# Patient Record
Sex: Female | Born: 1945 | Race: Black or African American | Hispanic: No | Smoking: Never smoker
Health system: Southern US, Community
[De-identification: ages and names within clinical notes are randomized; demographics above are authoritative.]

## PROBLEM LIST (undated history)

## (undated) DIAGNOSIS — I10 Essential (primary) hypertension: Secondary | ICD-10-CM

## (undated) HISTORY — PX: TUBAL LIGATION: SHX77

## (undated) HISTORY — PX: FOOT SURGERY: SHX648

---

## 1999-12-19 ENCOUNTER — Other Ambulatory Visit: Admission: RE | Admit: 1999-12-19 | Discharge: 1999-12-19 | Payer: Self-pay | Admitting: Gynecology

## 2000-12-22 ENCOUNTER — Other Ambulatory Visit: Admission: RE | Admit: 2000-12-22 | Discharge: 2000-12-22 | Payer: Self-pay | Admitting: Gynecology

## 2000-12-29 ENCOUNTER — Encounter: Payer: Self-pay | Admitting: Gynecology

## 2000-12-29 ENCOUNTER — Encounter: Admission: RE | Admit: 2000-12-29 | Discharge: 2000-12-29 | Payer: Self-pay | Admitting: Gynecology

## 2001-07-09 ENCOUNTER — Encounter: Payer: Self-pay | Admitting: Cardiology

## 2001-07-09 ENCOUNTER — Ambulatory Visit (HOSPITAL_COMMUNITY): Admission: RE | Admit: 2001-07-09 | Discharge: 2001-07-09 | Payer: Self-pay | Admitting: Cardiology

## 2002-01-26 ENCOUNTER — Encounter: Payer: Self-pay | Admitting: Gynecology

## 2002-01-26 ENCOUNTER — Encounter: Admission: RE | Admit: 2002-01-26 | Discharge: 2002-01-26 | Payer: Self-pay | Admitting: Gynecology

## 2002-04-27 ENCOUNTER — Other Ambulatory Visit: Admission: RE | Admit: 2002-04-27 | Discharge: 2002-04-27 | Payer: Self-pay | Admitting: Gynecology

## 2002-06-22 ENCOUNTER — Encounter: Payer: Self-pay | Admitting: Cardiology

## 2002-06-22 ENCOUNTER — Encounter: Admission: RE | Admit: 2002-06-22 | Discharge: 2002-06-22 | Payer: Self-pay | Admitting: Cardiology

## 2003-02-03 ENCOUNTER — Encounter: Payer: Self-pay | Admitting: Gynecology

## 2003-02-03 ENCOUNTER — Encounter: Admission: RE | Admit: 2003-02-03 | Discharge: 2003-02-03 | Payer: Self-pay | Admitting: Gynecology

## 2003-05-18 ENCOUNTER — Other Ambulatory Visit: Admission: RE | Admit: 2003-05-18 | Discharge: 2003-05-18 | Payer: Self-pay | Admitting: Gynecology

## 2004-02-07 ENCOUNTER — Encounter: Admission: RE | Admit: 2004-02-07 | Discharge: 2004-02-07 | Payer: Self-pay | Admitting: Gynecology

## 2004-02-07 ENCOUNTER — Other Ambulatory Visit: Admission: RE | Admit: 2004-02-07 | Discharge: 2004-02-07 | Payer: Self-pay | Admitting: Gynecology

## 2005-02-11 ENCOUNTER — Ambulatory Visit (HOSPITAL_COMMUNITY): Admission: RE | Admit: 2005-02-11 | Discharge: 2005-02-11 | Payer: Self-pay | Admitting: Gynecology

## 2005-02-11 ENCOUNTER — Other Ambulatory Visit: Admission: RE | Admit: 2005-02-11 | Discharge: 2005-02-11 | Payer: Self-pay | Admitting: Gynecology

## 2005-02-12 ENCOUNTER — Encounter: Admission: RE | Admit: 2005-02-12 | Discharge: 2005-02-12 | Payer: Self-pay | Admitting: Cardiology

## 2006-02-12 ENCOUNTER — Encounter: Admission: RE | Admit: 2006-02-12 | Discharge: 2006-02-12 | Payer: Self-pay | Admitting: Gynecology

## 2006-03-13 ENCOUNTER — Other Ambulatory Visit: Admission: RE | Admit: 2006-03-13 | Discharge: 2006-03-13 | Payer: Self-pay | Admitting: Gynecology

## 2006-07-28 ENCOUNTER — Encounter: Admission: RE | Admit: 2006-07-28 | Discharge: 2006-07-28 | Payer: Self-pay | Admitting: Cardiology

## 2006-11-13 ENCOUNTER — Encounter: Admission: RE | Admit: 2006-11-13 | Discharge: 2006-11-13 | Payer: Self-pay | Admitting: Rheumatology

## 2006-11-23 ENCOUNTER — Encounter: Admission: RE | Admit: 2006-11-23 | Discharge: 2006-11-23 | Payer: Self-pay | Admitting: Rheumatology

## 2007-02-16 ENCOUNTER — Encounter: Admission: RE | Admit: 2007-02-16 | Discharge: 2007-02-16 | Payer: Self-pay | Admitting: Gynecology

## 2007-02-20 ENCOUNTER — Encounter: Admission: RE | Admit: 2007-02-20 | Discharge: 2007-02-20 | Payer: Self-pay | Admitting: Rheumatology

## 2007-03-16 ENCOUNTER — Other Ambulatory Visit: Admission: RE | Admit: 2007-03-16 | Discharge: 2007-03-16 | Payer: Self-pay | Admitting: Gynecology

## 2008-02-17 ENCOUNTER — Encounter: Admission: RE | Admit: 2008-02-17 | Discharge: 2008-02-17 | Payer: Self-pay | Admitting: Gynecology

## 2008-05-24 ENCOUNTER — Encounter: Admission: RE | Admit: 2008-05-24 | Discharge: 2008-05-24 | Payer: Self-pay | Admitting: Rheumatology

## 2008-08-10 ENCOUNTER — Encounter: Admission: RE | Admit: 2008-08-10 | Discharge: 2008-08-10 | Payer: Self-pay | Admitting: Cardiology

## 2008-09-01 ENCOUNTER — Ambulatory Visit: Payer: Self-pay | Admitting: Thoracic Surgery

## 2008-11-16 ENCOUNTER — Encounter: Admission: RE | Admit: 2008-11-16 | Discharge: 2008-11-16 | Payer: Self-pay | Admitting: Thoracic Surgery

## 2008-11-16 ENCOUNTER — Ambulatory Visit: Payer: Self-pay | Admitting: Thoracic Surgery

## 2009-02-20 ENCOUNTER — Encounter: Admission: RE | Admit: 2009-02-20 | Discharge: 2009-02-20 | Payer: Self-pay | Admitting: Gynecology

## 2009-05-24 ENCOUNTER — Ambulatory Visit: Payer: Self-pay | Admitting: Thoracic Surgery

## 2009-05-24 ENCOUNTER — Encounter: Admission: RE | Admit: 2009-05-24 | Discharge: 2009-05-24 | Payer: Self-pay | Admitting: Thoracic Surgery

## 2009-09-25 ENCOUNTER — Ambulatory Visit: Payer: Self-pay | Admitting: Orthopedic Surgery

## 2009-09-25 DIAGNOSIS — M25569 Pain in unspecified knee: Secondary | ICD-10-CM

## 2009-09-27 ENCOUNTER — Telehealth: Payer: Self-pay | Admitting: Orthopedic Surgery

## 2009-10-03 ENCOUNTER — Encounter: Payer: Self-pay | Admitting: Orthopedic Surgery

## 2010-02-21 ENCOUNTER — Encounter: Admission: RE | Admit: 2010-02-21 | Discharge: 2010-02-21 | Payer: Self-pay | Admitting: Gynecology

## 2010-03-14 ENCOUNTER — Ambulatory Visit: Payer: Self-pay | Admitting: Thoracic Surgery

## 2010-03-14 ENCOUNTER — Encounter: Admission: RE | Admit: 2010-03-14 | Discharge: 2010-03-14 | Payer: Self-pay | Admitting: Thoracic Surgery

## 2010-05-14 IMAGING — CT CT CHEST W/O CM
2 of 4 series · 15 of 36 positions shown, 18 images · non-contrast
Comparison: Chest x-ray of 05/24/2008 and 02/20/2007

CLINICAL DATA: Follow up of lung nodule

CT CHEST WITHOUT CONTRAST
TECHNIQUE: Multidetector CT imaging of the chest was performed
following the standard protocol without IV contrast.

[Series 3: routine chest · axial · 0.55mm/px · z∈[-214,+32]mm · 12 of 59 slices shown, 15 images]
[im 5/59  mediastinal]
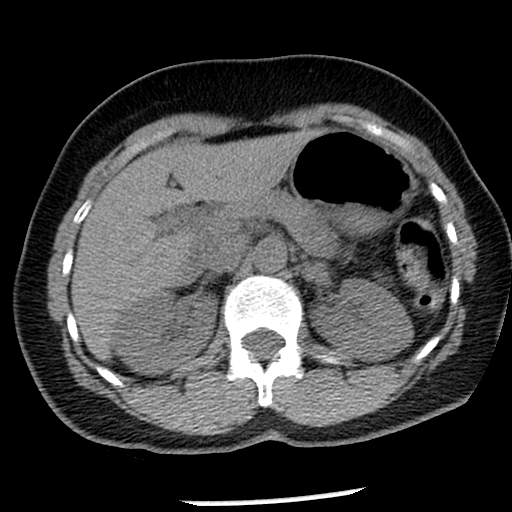
[im 5/59  lung]
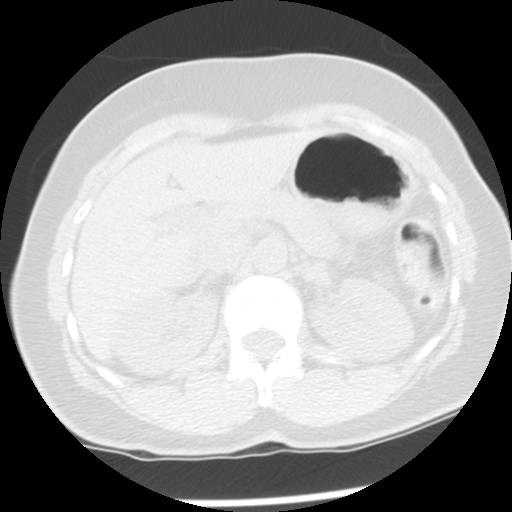
[im 9/59  lung]
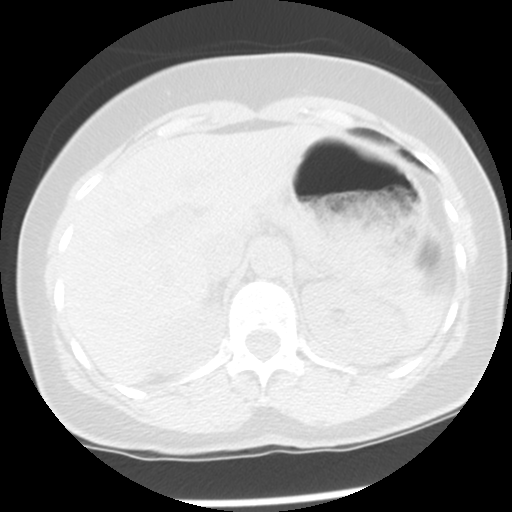
[im 13/59  lung]
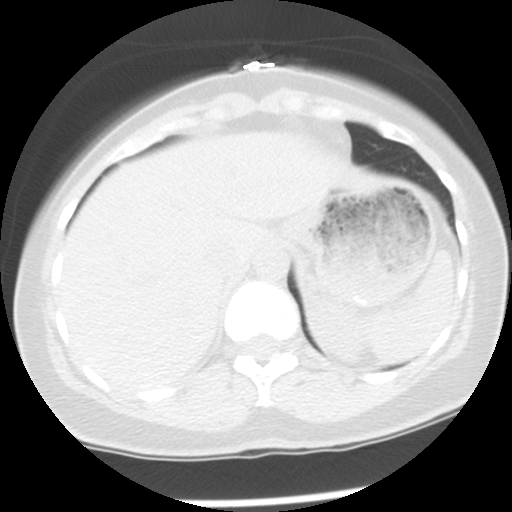
[im 17/59  lung]
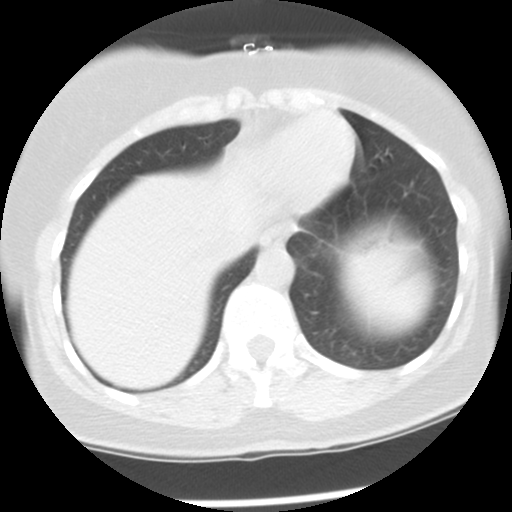
[im 21/59  mediastinal]
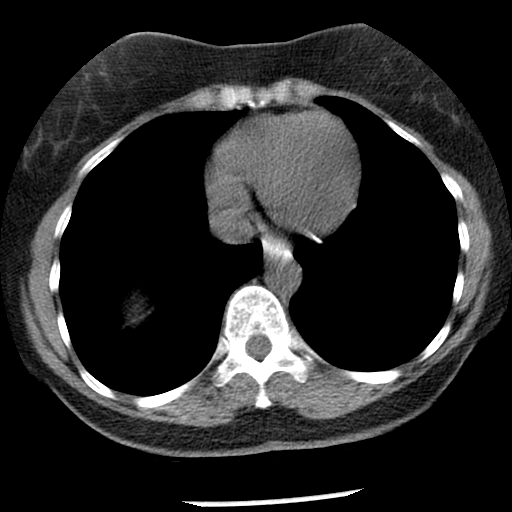
[im 21/59  lung]
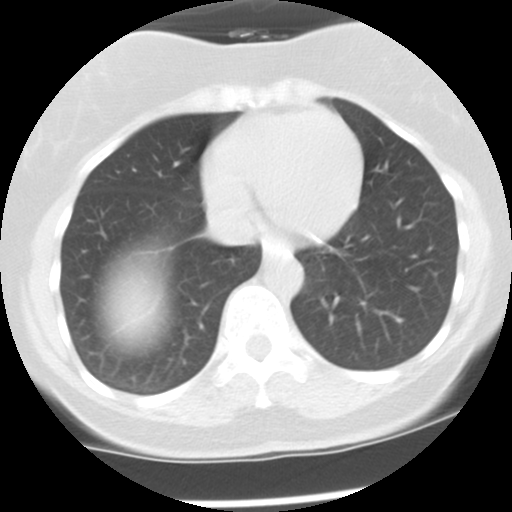
[im 25/59  lung]
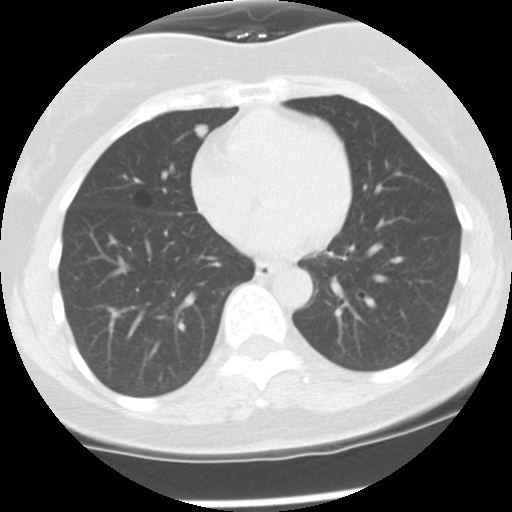
[im 34/59  lung]
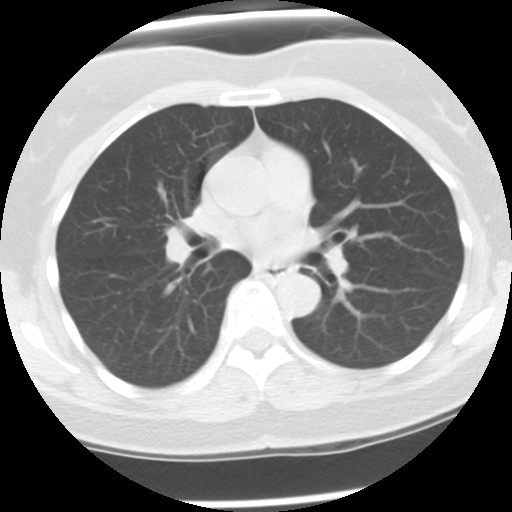
[im 38/59  lung]
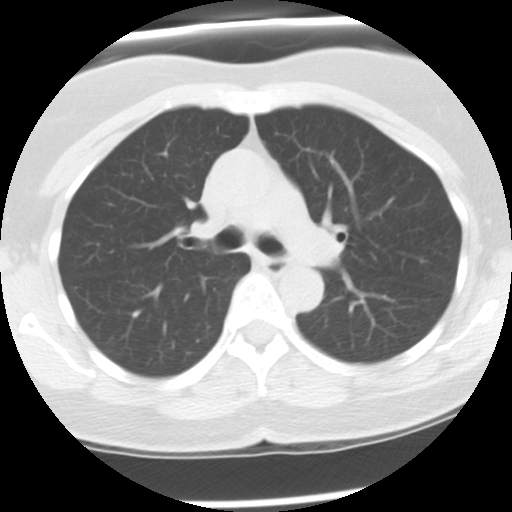
[im 42/59  mediastinal]
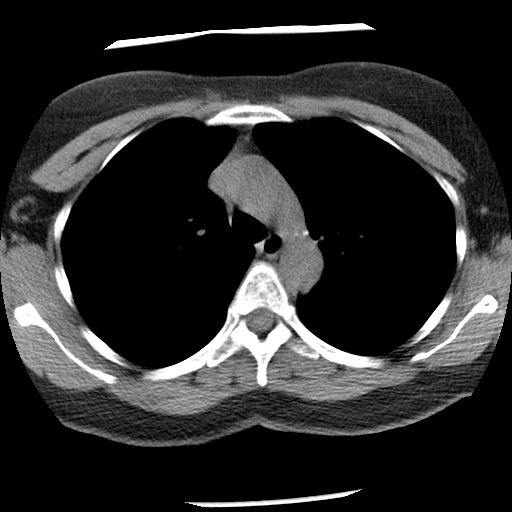
[im 42/59  lung]
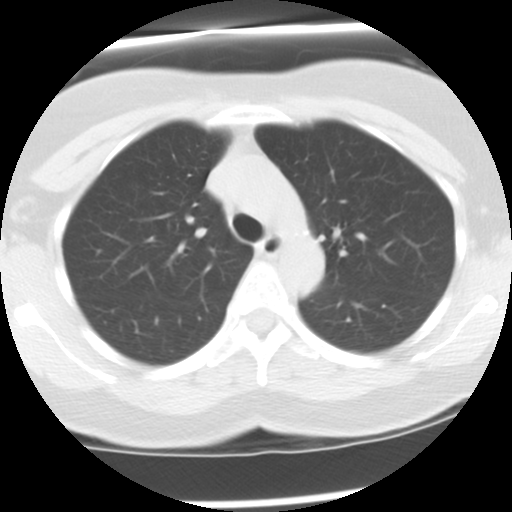
[im 46/59  lung]
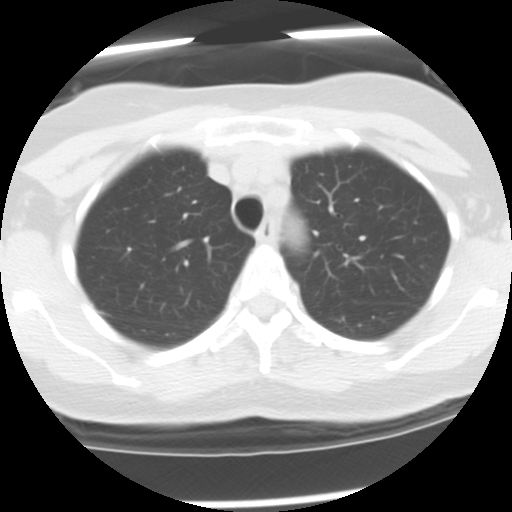
[im 50/59  lung]
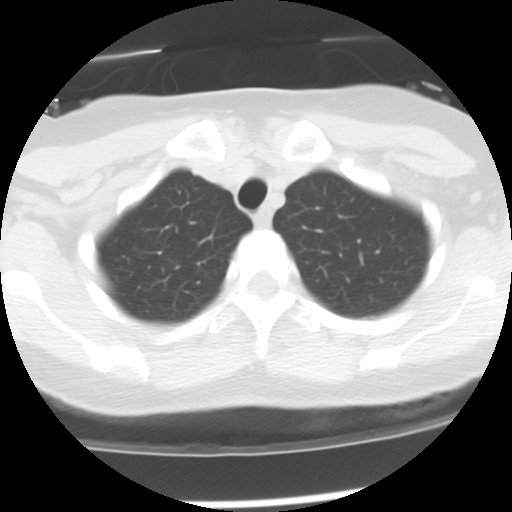
[im 54/59  lung]
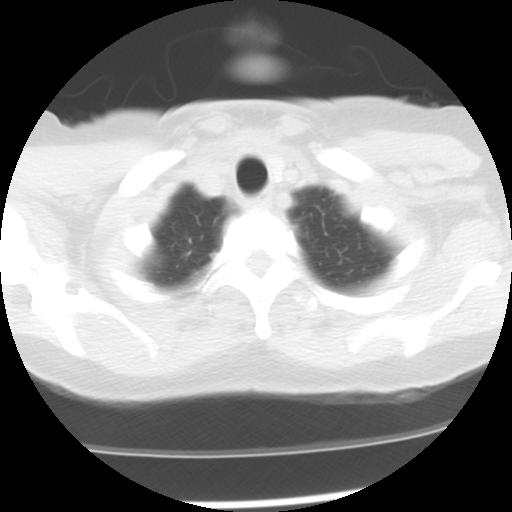

[Series 602: sagittal body · sagittal · 0.57mm/px · 3 of 114 slices shown]
[im 23/114  lung]
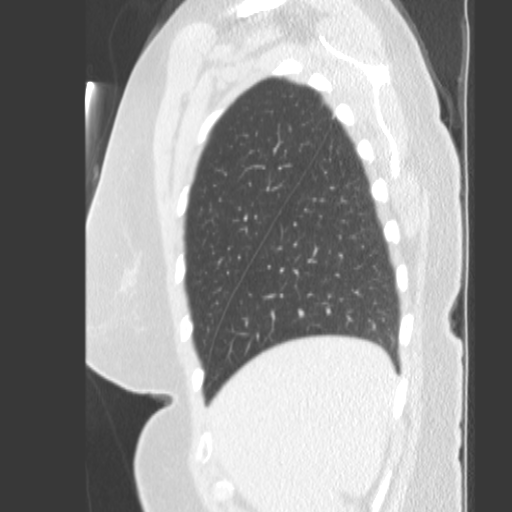
[im 46/114  lung]
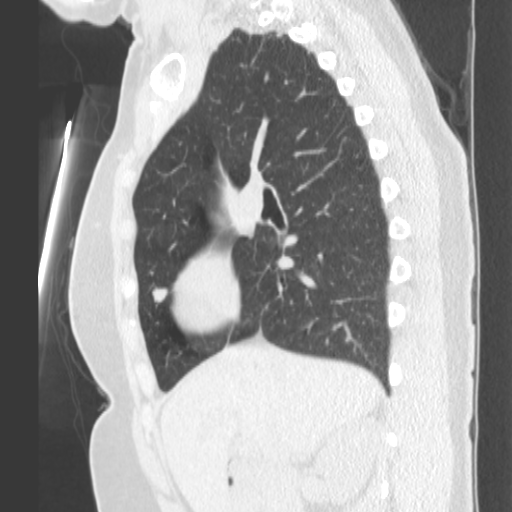
[im 68/114  lung]
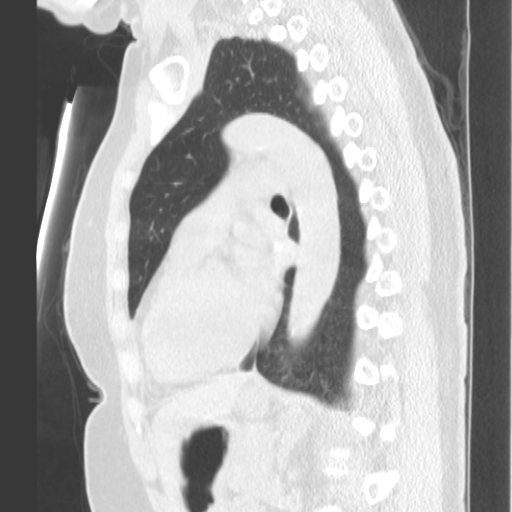

[15 of 36 positions shown; findings below may reference images not displayed]

FINDINGS: There is a noncalcified nodule anteromedially within the
right middle lobe measuring 8 x 7 mm..  Comparison with any prior
outside study documenting the presence of this nodule would be
recommended to assess any interval change in this nodule.  No other
pulmonary nodule is seen.  No infiltrate or effusion is noted.  No
mediastinal or hilar adenopathy is noted.  The left adrenal gland
is slightly prominent and somewhat low in attenuation on this
unenhanced study, suggesting incidental adrenal adenoma.  Again
direct comparison with any prior outside study that may demonstrate
the left adrenal gland is recommended.
IMPRESSION: 1.  8 x 7 mm noncalcified right middle lobe lung nodule.  Recommend
direct comparison with any prior outside study to assess stability.
2.  Enlarged left adrenal gland.  Possible adenoma but again
recommend direct comparison with any prior outside CT to assess
stability.

## 2011-01-28 ENCOUNTER — Other Ambulatory Visit: Payer: Self-pay | Admitting: Gynecology

## 2011-01-28 DIAGNOSIS — Z1231 Encounter for screening mammogram for malignant neoplasm of breast: Secondary | ICD-10-CM

## 2011-02-25 ENCOUNTER — Ambulatory Visit
Admission: RE | Admit: 2011-02-25 | Discharge: 2011-02-25 | Disposition: A | Discharge status: Home / Self Care | Payer: BC Managed Care – PPO | Source: Ambulatory Visit | Attending: Gynecology | Admitting: Gynecology

## 2011-02-25 DIAGNOSIS — Z1231 Encounter for screening mammogram for malignant neoplasm of breast: Secondary | ICD-10-CM

## 2011-02-26 NOTE — Letter (Signed)
March 14, 2010   Osvaldo Shipper. Spruill, MD  [address] [address]  [place], [place] [address]   Re:  Natalie Black, Natalie Black              DOB:  Feb 08, 1946   Dear Dr. Shana Chute:   I saw the patient back today for follow up for right middle lobe lesion.  CT scan showed the lesion is stable and maybe slightly smaller, and her  blood pressure was 121/71, pulse 76, respirations 18, sats were 100%.  So I feel this is a benign lesion we followed for almost 2 years.  I  will be happy to see her.  I will refer her back to you for long-term  followup.  I will be happy to see her again if she has any further  problems.   Sincerely,   Ines Bloomer, M.D.  Electronically Signed   DPB/MEDQ  D:  03/14/2010  T:  03/15/2010  Job:  [id number]

## 2011-02-26 NOTE — Letter (Signed)
May 24, 2009   Osvaldo Shipper. Spruill, MD  [address] [address]  [place], [place] [address]   Re:  Natalie Black, Natalie Black              DOB:  Mar 04, 1946   Dear Dr. Shana Chute:   I saw the patient back today.  Her blood pressure is 149/84, pulse 76,  respirations 18, sats were 99%.  A chest x-ray showed no evidence of no  change in her right middle lobe nodule.  The one in the middle right  lower lobe and left lower lobe were not visible on today's CT scan.  Because of the stability of this at 6 months, I am going to see her  again in 9 months for a final check, assuming this will be a benign  lesion.   Ines Bloomer, M.D.  Electronically Signed   DPB/MEDQ  D:  05/24/2009  T:  05/25/2009  Job:  [id number]

## 2011-02-26 NOTE — Letter (Signed)
September 01, 2008   Osvaldo Shipper. Spruill, MD  [address] [address]  [place], [place] [address]   Re:  SHATORI, BERTUCCI              DOB:  Oct 14, 1946   Dear Dorene Sorrow:   I appreciate the opportunity of seeing the patient.  This is a 65-year-  old African American female who was found to have an abdominal workup in  which a CT scan was done by Dr. Damian Leavell, showed an 8-mm lesion in the  right middle lobe.  A chest CT scan done 4 months later revealed an 8 x  10 mm lesion on the right middle lobe.  That was noncalcified.  She also  had an adrenal gland that showed probably a benign and an enlarged left  adrenal gland, which was thought to be a possible adenoma.  She is  referred here for evaluation.  She is a nonsmoker.  She has had no  hemoptysis, fever, chills, or excessive sputum.   MEDICATIONS:  She is on no medication.   ALLERGIES:  Claritin causes her to blur.   FAMILY HISTORY:  Noncontributory.   SOCIAL HISTORY:  Single, has one child, works as a Government social research officer.  Drinks alcohol on a regular basis.   REVIEW OF SYSTEMS:  She is 188 pounds.  She is 5 feet 6 inches.  She has  had some weight loss.  CARDIAC:  No angina or atrial fibrillation.  PULMONARY:  No hemoptysis, fever, chills, or excessive sputum.  GI:  No  nausea, vomiting, constipation, or diarrhea.  GU:  No kidney disease,  dysuria, or frequent urination.  VASCULAR:  No claudication, DVT, or  TIAs.  Neurologic:  No dizziness, headaches, blackouts, or seizures.  MUSCULOSKELETAL:  No arthritis.  No psychiatric illness.  EYE/ENT:  No  change in her eyesight or hearing.  HEMATOLOGICAL:  No problems with  bleeding, clotting disorders, or anemia.   PHYSICAL EXAMINATION:  GENERAL:  She is a well-developed female in no  acute distress.  VITAL SIGNS:  Blood pressure is 162/81, pulse 68, respirations 18, and  sats were 99%.   Pulmonary function tests were normal.  I have reevaluated the CT scan  that I believe this is a noncalcified  nodule in that area.  This is one  that the PET scan may or may not be positive since this is less than 1  cm.  The findings may not  be accurate.It may be too small to have a  needle biopsy done so, I think the  best option is to follow this.  Particularly since there was apparently minimal change between January  and October on CT scan.   PLAN:  To repeat her CT scan in another 3 months and we will see her  back again in February 2010.   Ines Bloomer, M.D.  Electronically Signed   DPB/MEDQ  D:  09/01/2008  T:  09/02/2008  Job:  [id number]

## 2011-02-26 NOTE — Letter (Signed)
November 16, 2008   Osvaldo Shipper. Spruill, MD  [address]  [place], [place] [address]   Re:  DEJANAE, HELSER              DOB:  08-07-1946   Dear Kevin Fenton,   I saw the patient back in the office today for followup of her CT scan.  In the right upper lobe, an 8-mm nodule was stable, but there are 2  other small nodules obviously one in the right lower lobe and the one in  the left lower lobe, one being about 6 mm now and the other one 7 mm.  These are new nodules and have, ground-glass appearance, so they could  be some type of inflammatory lesion.  The original nodule as mentioned  is unchanged.  She has had a recent history of inflammatory area.  Because of this, I think we can still follow her.  I will plan to see  her back in 6 months and try another CT scan.  If there is continued  change, then we will have to see with biopsy or PET scan.  Her blood  pressure is 167/88, pulse 74, respirations 60, and sats are 100%.   Ines Bloomer, M.D.  Electronically Signed   DPB/MEDQ  D:  11/16/2008  T:  11/17/2008  Job:  [id number]

## 2011-09-09 DIAGNOSIS — I10 Essential (primary) hypertension: Secondary | ICD-10-CM | POA: Insufficient documentation

## 2011-09-09 DIAGNOSIS — H43399 Other vitreous opacities, unspecified eye: Secondary | ICD-10-CM | POA: Insufficient documentation

## 2011-09-09 NOTE — ED Notes (Signed)
Flashes to right eye since last night.

## 2011-09-10 ENCOUNTER — Emergency Department (HOSPITAL_COMMUNITY)
Admission: EM | Admit: 2011-09-10 | Discharge: 2011-09-10 | Disposition: A | Discharge status: Home / Self Care | Payer: BC Managed Care – PPO | Attending: Emergency Medicine | Admitting: Emergency Medicine

## 2011-09-10 DIAGNOSIS — H43391 Other vitreous opacities, right eye: Secondary | ICD-10-CM

## 2011-09-10 HISTORY — DX: Essential (primary) hypertension: I10

## 2011-09-10 NOTE — ED Provider Notes (Signed)
History     CSN: [id number] Arrival date & time: 09/10/2011 12:48 AM   First MD Initiated Contact with Patient 09/10/11 0155      Chief Complaint  Patient presents with  . Eye Problem    flashes to right eye    (Consider location/radiation/quality/duration/timing/severity/associated sxs/prior treatment) Patient is a 65 y.o. female presenting with eye problem. The history is provided by the patient.  Eye Problem  This is a new problem. The current episode started 6 to 12 hours ago. Episode frequency: Every few minutes until arriving here and has now resolved. The problem has been gradually improving. There is pain in the right eye. The injury mechanism is unknown. The patient is experiencing no pain. There is no history of trauma to the eye. There is no known exposure to pink eye. She does not wear contacts. Pertinent negatives include no blurred vision, no decreased vision, no discharge, no double vision, no foreign body sensation, no photophobia, no eye redness, no nausea, no vomiting, no tingling, no weakness and no itching. She has tried nothing for the symptoms. The treatment provided no relief.   No history of eye problems in the past. At home she started noticing her symptoms. Mild to moderate in severity. No aggravating or alleviating factors. No change in vision. Patient called the nurse line and was told to get here within 4 hours. She denies any weakness, numbness any speech difficulty or difficulty with her ambulation. No stroke symptoms.  Past Medical History  Diagnosis Date  . Hypertension     Past Surgical History  Procedure Date  . Foot surgery   . Tubal ligation     No family history on file.  History  Substance Use Topics  . Smoking status: Never Smoker   . Smokeless tobacco: Not on file  . Alcohol Use: Yes     occ    OB History    Grav Para Term Preterm Abortions TAB SAB Ect Mult Living                  Review of Systems  Constitutional: Negative  for fever and chills.  HENT: Negative for neck pain and neck stiffness.   Eyes: Positive for visual disturbance. Negative for blurred vision, double vision, photophobia, pain, discharge and redness.  Respiratory: Negative for shortness of breath.   Cardiovascular: Negative for chest pain.  Gastrointestinal: Negative for nausea, vomiting and abdominal pain.  Genitourinary: Negative for dysuria.  Musculoskeletal: Negative for back pain.  Skin: Negative for itching and rash.  Neurological: Negative for tingling, weakness and headaches.  All other systems reviewed and are negative.    Allergies  Loratadine  Home Medications   Current Outpatient Rx  Name Route Sig Dispense Refill  . AMLODIPINE BESYLATE-VALSARTAN 5-160 MG PO TABS Oral Take 1 tablet by mouth daily.      Marland Kitchen DOXYCYCLINE HYCLATE 100 MG PO TABS Oral Take 100 mg by mouth 2 (two) times daily.      Marland Kitchen FLUOCINONIDE 0.05 % EX CREA Topical Apply topically 2 (two) times daily.        Ht 5\' 6"  (1.676 m)  Wt 139 lb (63.05 kg)  BMI 22.44 kg/m2  Physical Exam  Constitutional: She is oriented to person, place, and time. She appears well-developed and well-nourished.  HENT:  Head: Normocephalic and atraumatic.  Eyes: Conjunctivae and EOM are normal. Pupils are equal, round, and reactive to light. Left eye exhibits no discharge. No scleral icterus.  Right eye appears normal without any periorbital swelling or erythema. Conjunctiva clear. Normal angle anterior chamber.  Bedside ultrasound: No evidence of retinal detachment by limited bedside ultrasound exam.  Neck: Trachea normal. Neck supple. No thyromegaly present.  Cardiovascular: Normal rate, regular rhythm, S1 normal, S2 normal and normal pulses.     No systolic murmur is present   No diastolic murmur is present  Pulses:      Radial pulses are 2+ on the right side, and 2+ on the left side.  Pulmonary/Chest: Effort normal and breath sounds normal. She has no wheezes. She has  no rhonchi. She has no rales. She exhibits no tenderness.  Abdominal: Soft. Normal appearance and bowel sounds are normal. There is no tenderness. There is no CVA tenderness and negative Murphy's sign.  Musculoskeletal:       BLE:s Calves nontender, no cords or erythema, negative Homans sign  Neurological: She is alert and oriented to person, place, and time. She has normal strength. No cranial nerve deficit or sensory deficit. GCS eye subscore is 4. GCS verbal subscore is 5. GCS motor subscore is 6.  Skin: Skin is warm and dry. No rash noted. She is not diaphoretic.  Psychiatric: Her speech is normal.       Cooperative and appropriate    ED Course  Procedures (including critical care time)  Symptoms of possible retinal tear or detachment. I performed a limited bedside ultrasound without any obvious retinal abnormality. Consulted ophthalmology. Visual acuities within normal limits  2:38 AM case discussed as above with Dr. Bing Plume on-call for ophthalmology. He agrees to close followup in the clinic today. His office opens at 7:30 AM and patient is to call to be seen in clinic.   MDM   Right eye floaters and flashes concerning for retinal tear or detachment with normal exam and findings as above. Plan close ophthalmology followup. Patient agrees to plan and strict return precautions.       Sunnie Nielsen, MD 09/10/11 [phone]

## 2011-09-10 NOTE — ED Notes (Signed)
Pt reports a "flash of light that looks like lightening" to her right eye. Pt states "I noticed it last night but not any today until about 7:30 this evening. It's like a flash of light and I see these black spots." Pt denies any injury or pain.

## 2012-01-20 ENCOUNTER — Other Ambulatory Visit: Payer: Self-pay | Admitting: Gynecology

## 2012-01-20 DIAGNOSIS — Z1231 Encounter for screening mammogram for malignant neoplasm of breast: Secondary | ICD-10-CM

## 2012-02-26 ENCOUNTER — Ambulatory Visit
Admission: RE | Admit: 2012-02-26 | Discharge: 2012-02-26 | Disposition: A | Discharge status: Home / Self Care | Payer: BC Managed Care – PPO | Source: Ambulatory Visit | Attending: Gynecology | Admitting: Gynecology

## 2012-02-26 DIAGNOSIS — Z1231 Encounter for screening mammogram for malignant neoplasm of breast: Secondary | ICD-10-CM

## 2013-01-25 ENCOUNTER — Other Ambulatory Visit: Payer: Self-pay

## 2013-01-25 DIAGNOSIS — Z1231 Encounter for screening mammogram for malignant neoplasm of breast: Secondary | ICD-10-CM

## 2013-03-02 ENCOUNTER — Ambulatory Visit
Admission: RE | Admit: 2013-03-02 | Discharge: 2013-03-02 | Disposition: A | Discharge status: Home / Self Care | Payer: Medicare Other | Source: Ambulatory Visit

## 2013-03-02 DIAGNOSIS — Z1231 Encounter for screening mammogram for malignant neoplasm of breast: Secondary | ICD-10-CM

## 2014-01-26 ENCOUNTER — Other Ambulatory Visit: Payer: Self-pay

## 2014-02-04 ENCOUNTER — Other Ambulatory Visit: Payer: Self-pay

## 2014-02-04 DIAGNOSIS — Z1231 Encounter for screening mammogram for malignant neoplasm of breast: Secondary | ICD-10-CM

## 2014-03-03 ENCOUNTER — Encounter (INDEPENDENT_AMBULATORY_CARE_PROVIDER_SITE_OTHER): Payer: Self-pay

## 2014-03-03 ENCOUNTER — Ambulatory Visit
Admission: RE | Admit: 2014-03-03 | Discharge: 2014-03-03 | Disposition: A | Discharge status: Home / Self Care | Payer: Medicare Other | Source: Ambulatory Visit

## 2014-03-03 DIAGNOSIS — Z1231 Encounter for screening mammogram for malignant neoplasm of breast: Secondary | ICD-10-CM

## 2014-12-08 ENCOUNTER — Emergency Department (HOSPITAL_COMMUNITY): Payer: Medicare Other

## 2014-12-08 ENCOUNTER — Encounter (HOSPITAL_COMMUNITY): Payer: Self-pay | Admitting: Emergency Medicine

## 2014-12-08 ENCOUNTER — Emergency Department (HOSPITAL_COMMUNITY)
Admission: EM | Admit: 2014-12-08 | Discharge: 2014-12-08 | Disposition: A | Discharge status: Home / Self Care | Payer: Medicare Other | Attending: Emergency Medicine | Admitting: Emergency Medicine

## 2014-12-08 DIAGNOSIS — Z7982 Long term (current) use of aspirin: Secondary | ICD-10-CM | POA: Diagnosis not present

## 2014-12-08 DIAGNOSIS — Y9289 Other specified places as the place of occurrence of the external cause: Secondary | ICD-10-CM | POA: Insufficient documentation

## 2014-12-08 DIAGNOSIS — Z79899 Other long term (current) drug therapy: Secondary | ICD-10-CM | POA: Insufficient documentation

## 2014-12-08 DIAGNOSIS — Y9389 Activity, other specified: Secondary | ICD-10-CM | POA: Diagnosis not present

## 2014-12-08 DIAGNOSIS — Y998 Other external cause status: Secondary | ICD-10-CM | POA: Diagnosis not present

## 2014-12-08 DIAGNOSIS — S99911A Unspecified injury of right ankle, initial encounter: Secondary | ICD-10-CM | POA: Diagnosis present

## 2014-12-08 DIAGNOSIS — S93401A Sprain of unspecified ligament of right ankle, initial encounter: Secondary | ICD-10-CM | POA: Diagnosis not present

## 2014-12-08 DIAGNOSIS — W010XXA Fall on same level from slipping, tripping and stumbling without subsequent striking against object, initial encounter: Secondary | ICD-10-CM | POA: Insufficient documentation

## 2014-12-08 DIAGNOSIS — I10 Essential (primary) hypertension: Secondary | ICD-10-CM | POA: Insufficient documentation

## 2014-12-08 MED ORDER — IBUPROFEN 400 MG PO TABS
400.0000 mg | ORAL_TABLET | Freq: Once | ORAL | Status: AC
  Administered 2014-12-08: 400 mg via ORAL
  Filled 2014-12-08: qty 1

## 2014-12-08 MED ORDER — TRAMADOL HCL 50 MG PO TABS
50.0000 mg | ORAL_TABLET | Freq: Once | ORAL | Status: AC
  Administered 2014-12-08: 50 mg via ORAL
  Filled 2014-12-08: qty 1

## 2014-12-08 MED ORDER — TRAMADOL HCL 50 MG PO TABS: 50.0000 mg | ORAL_TABLET | Freq: Four times a day (QID) | ORAL | Status: AC | PRN

## 2014-12-08 NOTE — ED Notes (Signed)
Pt tripped in fell this evening, complaining of right foot and leg pain

## 2014-12-08 NOTE — Discharge Instructions (Signed)
Ankle Sprain °An ankle sprain is an injury to the strong, fibrous tissues (ligaments) that hold the bones of your ankle joint together.  °CAUSES °An ankle sprain is usually caused by a fall or by twisting your ankle. Ankle sprains most commonly occur when you step on the outer edge of your foot, and your ankle turns inward. People who participate in sports are more prone to these types of injuries.  °SYMPTOMS  °· Pain in your ankle. The pain may be present at rest or only when you are trying to stand or walk. °· Swelling. °· Bruising. Bruising may develop immediately or within 1 to 2 days after your injury. °· Difficulty standing or walking, particularly when turning corners or changing directions. °DIAGNOSIS  °Your caregiver will ask you details about your injury and perform a physical exam of your ankle to determine if you have an ankle sprain. During the physical exam, your caregiver will press on and apply pressure to specific areas of your foot and ankle. Your caregiver will try to move your ankle in certain ways. An X-ray exam may be done to be sure a bone was not broken or a ligament did not separate from one of the bones in your ankle (avulsion fracture).  °TREATMENT  °Certain types of braces can help stabilize your ankle. Your caregiver can make a recommendation for this. Your caregiver may recommend the use of medicine for pain. If your sprain is severe, your caregiver may refer you to a surgeon who helps to restore function to parts of your skeletal system (orthopedist) or a physical therapist. °HOME CARE INSTRUCTIONS  °· Apply ice to your injury for 1-2 days or as directed by your caregiver. Applying ice helps to reduce inflammation and pain. °¨ Put ice in a plastic bag. °¨ Place a towel between your skin and the bag. °¨ Leave the ice on for 15-20 minutes at a time, every 2 hours while you are awake. °· Only take over-the-counter or prescription medicines for pain, discomfort, or fever as directed by  your caregiver. °· Elevate your injured ankle above the level of your heart as much as possible for 2-3 days. °· If your caregiver recommends crutches, use them as instructed. Gradually put weight on the affected ankle. Continue to use crutches or a cane until you can walk without feeling pain in your ankle. °· If you have a plaster splint, wear the splint as directed by your caregiver. Do not rest it on anything harder than a pillow for the first 24 hours. Do not put weight on it. Do not get it wet. You may take it off to take a shower or bath. °· You may have been given an elastic bandage to wear around your ankle to provide support. If the elastic bandage is too tight (you have numbness or tingling in your foot or your foot becomes cold and blue), adjust the bandage to make it comfortable. °· If you have an air splint, you may blow more air into it or let air out to make it more comfortable. You may take your splint off at night and before taking a shower or bath. Wiggle your toes in the splint several times per day to decrease swelling. °SEEK MEDICAL CARE IF:  °· You have rapidly increasing bruising or swelling. °· Your toes feel extremely cold or you lose feeling in your foot. °· Your pain is not relieved with medicine. °SEEK IMMEDIATE MEDICAL CARE IF: °· Your toes are numb or blue. °·   You have severe pain that is increasing. MAKE SURE YOU:   Understand these instructions.  Will watch your condition.  Will get help right away if you are not doing well or get worse. Document Released: 09/30/2005 Document Revised: 06/24/2012 Document Reviewed: 10/12/2011 [facility] Patient Information 2015 [employer], [place]. This information is not intended to replace advice given to you by your health care provider. Make sure you discuss any questions you have with your health care provider.  Wear the ASO and use crutches to avoid weight bearing.  Use ice and elevation as much as possible for the next several days to help  reduce the swelling.  Take the medications prescribed.  You may take the tramadol prescribed for pain relief.  This will make you drowsy - do not drive within 4 hours of taking this medication.  You may also use tylenol for pain relief.  Call your primary doctor for a recheck in one week if pain and swelling is not improving.  You may benefit from physical therapy of your ankle if it is not getting better over the next week.

## 2014-12-08 NOTE — ED Provider Notes (Signed)
CSN: [id number]     Arrival date & time 12/08/14  1913 History   First MD Initiated Contact with Patient 12/08/14 1940     Chief Complaint  Patient presents with  . Leg Pain     (Consider location/radiation/quality/duration/timing/severity/associated sxs/prior Treatment) The history is provided by the patient.   Natalie Black is a 69 y.o. female presenting with pain in her right ankle and lower leg since she tripped and fell on wet grass around noon today.  She wrapped the ankle in an ace wrap and attempted to go bowling this evening but developed increased pain and swelling.  She denies numbness in her feet or toes.  She has taken no medicines prior to arrival for pain. She was able to weight bear initially and denies any other pain or injury, including no head injury.     Past Medical History  Diagnosis Date  . Hypertension    Past Surgical History  Procedure Laterality Date  . Foot surgery    . Tubal ligation     No family history on file. History  Substance Use Topics  . Smoking status: Never Smoker   . Smokeless tobacco: Not on file  . Alcohol Use: Yes     Comment: occ   OB History    No data available     Review of Systems  Musculoskeletal: Positive for joint swelling and arthralgias.  Skin: Negative for wound.  Neurological: Negative for weakness and numbness.      Allergies  Loratadine  Home Medications   Prior to Admission medications   Medication Sig Start Date End Date Taking? Authorizing Provider  amLODipine-valsartan (EXFORGE) 5-160 MG per tablet Take 1 tablet by mouth daily.     Yes Historical Provider, MD  aspirin EC 81 MG tablet Take 81 mg by mouth daily.   Yes Historical Provider, MD  fluocinonide ointment (LIDEX) 0.05 % Apply 1 application topically daily. 11/08/14  Yes Historical Provider, MD  Multiple Vitamin (MULTIVITAMIN WITH MINERALS) TABS tablet Take 1 tablet by mouth daily.   Yes Historical Provider, MD  TURPENTINE EX Apply 1  application topically once as needed (for pain).   Yes Historical Provider, MD   BP 151/100 mmHg  Pulse 88  Temp(Src) 98.4 F (36.9 C) (Oral)  Resp 22  Ht 5\' 6"  (1.676 m)  Wt 136 lb (61.689 kg)  BMI 21.96 kg/m2  SpO2 99% Physical Exam  Constitutional: She appears well-developed and well-nourished.  HENT:  Head: Normocephalic.  Cardiovascular: Normal rate and intact distal pulses.  Exam reveals no decreased pulses.   Pulses:      Dorsalis pedis pulses are 2+ on the right side, and 2+ on the left side.       Posterior tibial pulses are 2+ on the right side, and 2+ on the left side.  Musculoskeletal: She exhibits edema and tenderness.       Right ankle: She exhibits decreased range of motion and swelling. She exhibits no ecchymosis, no deformity and normal pulse. Tenderness. Lateral malleolus tenderness found. No head of 5th metatarsal and no proximal fibula tenderness found. Achilles tendon normal.  Neurological: She is alert. No sensory deficit.  Skin: Skin is warm, dry and intact.  Nursing note and vitals reviewed.   ED Course  Procedures (including critical care time) Labs Review Labs Reviewed - No data to display  Imaging Review Dg Ankle Complete Right  12/08/2014   CLINICAL DATA:  Slipped and fell in mud.  Severe right ankle  pain.  EXAM: RIGHT ANKLE - COMPLETE 3+ VIEW  COMPARISON:  MRI 11/19/2006  FINDINGS: Negative for fracture or dislocation. Possible lateral soft tissue swelling. Probable soft tissue swelling along the anterior aspect of the ankle.  IMPRESSION: No acute bone abnormality.  Probable soft tissue swelling.   Electronically Signed   By: Richarda Overlie M.D.   On: 12/08/2014 20:10     EKG Interpretation None      MDM   Final diagnoses:  Ankle sprain, right, initial encounter    ASO and crutches provided.  Cap refill normal after ASO applied.  RICE, f/u with pcp if pain symptoms and swelling are not better over the next week.  Tramadol.      Burgess Amor,  PA-C 12/08/14 2045  Raeford Razor, MD 12/14/14 [phone]

## 2015-04-14 ENCOUNTER — Other Ambulatory Visit: Payer: Self-pay

## 2015-04-14 DIAGNOSIS — Z1231 Encounter for screening mammogram for malignant neoplasm of breast: Secondary | ICD-10-CM

## 2015-04-20 ENCOUNTER — Ambulatory Visit: Payer: Medicare Other

## 2015-04-25 ENCOUNTER — Ambulatory Visit
Admission: RE | Admit: 2015-04-25 | Discharge: 2015-04-25 | Disposition: A | Discharge status: Home / Self Care | Payer: Medicare Other | Source: Ambulatory Visit

## 2015-04-25 DIAGNOSIS — Z1231 Encounter for screening mammogram for malignant neoplasm of breast: Secondary | ICD-10-CM

## 2015-04-26 ENCOUNTER — Ambulatory Visit: Payer: Medicare Other

## 2016-07-01 ENCOUNTER — Other Ambulatory Visit: Payer: Self-pay | Admitting: Gynecology

## 2016-07-01 DIAGNOSIS — Z1231 Encounter for screening mammogram for malignant neoplasm of breast: Secondary | ICD-10-CM

## 2016-07-04 ENCOUNTER — Ambulatory Visit
Admission: RE | Admit: 2016-07-04 | Discharge: 2016-07-04 | Disposition: A | Discharge status: Home / Self Care | Payer: Medicare Other | Source: Ambulatory Visit | Attending: Gynecology | Admitting: Gynecology

## 2016-07-04 DIAGNOSIS — Z1231 Encounter for screening mammogram for malignant neoplasm of breast: Secondary | ICD-10-CM

## 2019-05-12 ENCOUNTER — Other Ambulatory Visit: Payer: Self-pay

## 2019-05-12 ENCOUNTER — Other Ambulatory Visit: Payer: Medicare Other

## 2019-05-12 DIAGNOSIS — Z20822 Contact with and (suspected) exposure to covid-19: Secondary | ICD-10-CM

## 2019-05-13 LAB — NOVEL CORONAVIRUS, NAA: SARS-CoV-2, NAA: NOT DETECTED

## 2019-08-10 ENCOUNTER — Other Ambulatory Visit: Payer: Self-pay

## 2019-08-10 DIAGNOSIS — Z20822 Contact with and (suspected) exposure to covid-19: Secondary | ICD-10-CM

## 2019-08-12 LAB — NOVEL CORONAVIRUS, NAA: SARS-CoV-2, NAA: NOT DETECTED

## 2019-11-10 DIAGNOSIS — Z20822 Contact with and (suspected) exposure to covid-19: Secondary | ICD-10-CM | POA: Diagnosis not present

## 2019-11-20 ENCOUNTER — Ambulatory Visit: Payer: Self-pay | Attending: Internal Medicine

## 2019-11-20 ENCOUNTER — Other Ambulatory Visit: Payer: Self-pay

## 2019-11-20 DIAGNOSIS — Z23 Encounter for immunization: Secondary | ICD-10-CM

## 2019-11-20 NOTE — Progress Notes (Signed)
   Covid-19 Vaccination Clinic  Name:  Natalie Black    MRN: [id number] DOB: July 09, 1946  11/20/2019  Natalie Black was observed post Covid-19 immunization for 15 minutes without incidence. She was provided with Vaccine Information Sheet and instruction to access the V-Safe system.   Natalie Black was instructed to call 911 with any severe reactions post vaccine: Marland Kitchen Difficulty breathing  . Swelling of your face and throat  . A fast heartbeat  . A bad rash all over your body  . Dizziness and weakness

## 2019-12-14 ENCOUNTER — Ambulatory Visit: Payer: Medicare PPO | Attending: Internal Medicine

## 2019-12-14 DIAGNOSIS — Z23 Encounter for immunization: Secondary | ICD-10-CM | POA: Insufficient documentation

## 2019-12-14 NOTE — Progress Notes (Signed)
   Covid-19 Vaccination Clinic  Name:  Natalie Black    MRN: [id number] DOB: 1946/06/18  12/14/2019  [name] was observed post Covid-19 immunization for 15 minutes without incident. She was provided with Vaccine Information Sheet and instruction to access the V-Safe system.   [name] was instructed to call 911 with any severe reactions post vaccine: Marland Kitchen Difficulty breathing  . Swelling of face and throat  . A fast heartbeat  . A bad rash all over body  . Dizziness and weakness   Immunizations Administered    Name Date Dose VIS Date Route   Moderna COVID-19 Vaccine 12/14/2019  4:03 PM 0.5 mL 09/14/2019 Intramuscular   Manufacturer: Moderna   Lot: [id number]   NDC: 83254-982-64

## 2019-12-20 DIAGNOSIS — L669 Cicatricial alopecia, unspecified: Secondary | ICD-10-CM | POA: Diagnosis not present

## 2019-12-21 ENCOUNTER — Ambulatory Visit: Payer: Self-pay

## 2020-01-04 DIAGNOSIS — H25813 Combined forms of age-related cataract, bilateral: Secondary | ICD-10-CM | POA: Diagnosis not present

## 2020-01-04 DIAGNOSIS — H43812 Vitreous degeneration, left eye: Secondary | ICD-10-CM | POA: Diagnosis not present

## 2020-01-04 DIAGNOSIS — H40013 Open angle with borderline findings, low risk, bilateral: Secondary | ICD-10-CM | POA: Diagnosis not present

## 2020-01-04 DIAGNOSIS — H35031 Hypertensive retinopathy, right eye: Secondary | ICD-10-CM | POA: Diagnosis not present

## 2020-03-14 DIAGNOSIS — E782 Mixed hyperlipidemia: Secondary | ICD-10-CM | POA: Diagnosis not present

## 2020-03-14 DIAGNOSIS — R7301 Impaired fasting glucose: Secondary | ICD-10-CM | POA: Diagnosis not present

## 2020-03-14 DIAGNOSIS — Z6823 Body mass index (BMI) 23.0-23.9, adult: Secondary | ICD-10-CM | POA: Diagnosis not present

## 2020-03-14 DIAGNOSIS — I10 Essential (primary) hypertension: Secondary | ICD-10-CM | POA: Diagnosis not present

## 2020-07-07 DIAGNOSIS — H35031 Hypertensive retinopathy, right eye: Secondary | ICD-10-CM | POA: Diagnosis not present

## 2020-07-07 DIAGNOSIS — H43812 Vitreous degeneration, left eye: Secondary | ICD-10-CM | POA: Diagnosis not present

## 2020-07-07 DIAGNOSIS — H40013 Open angle with borderline findings, low risk, bilateral: Secondary | ICD-10-CM | POA: Diagnosis not present

## 2020-07-07 DIAGNOSIS — H25813 Combined forms of age-related cataract, bilateral: Secondary | ICD-10-CM | POA: Diagnosis not present

## 2020-07-25 DIAGNOSIS — Z23 Encounter for immunization: Secondary | ICD-10-CM | POA: Diagnosis not present

## 2020-08-03 DIAGNOSIS — L089 Local infection of the skin and subcutaneous tissue, unspecified: Secondary | ICD-10-CM | POA: Diagnosis not present

## 2020-08-03 DIAGNOSIS — L669 Cicatricial alopecia, unspecified: Secondary | ICD-10-CM | POA: Diagnosis not present

## 2020-08-10 DIAGNOSIS — H35031 Hypertensive retinopathy, right eye: Secondary | ICD-10-CM | POA: Diagnosis not present

## 2020-08-10 DIAGNOSIS — H40013 Open angle with borderline findings, low risk, bilateral: Secondary | ICD-10-CM | POA: Diagnosis not present

## 2020-08-10 DIAGNOSIS — H25813 Combined forms of age-related cataract, bilateral: Secondary | ICD-10-CM | POA: Diagnosis not present

## 2020-08-10 DIAGNOSIS — H35372 Puckering of macula, left eye: Secondary | ICD-10-CM | POA: Diagnosis not present

## 2020-08-10 DIAGNOSIS — H43812 Vitreous degeneration, left eye: Secondary | ICD-10-CM | POA: Diagnosis not present

## 2020-08-17 ENCOUNTER — Ambulatory Visit: Payer: Medicare PPO | Attending: Internal Medicine

## 2020-08-17 ENCOUNTER — Other Ambulatory Visit: Payer: Self-pay | Admitting: Gynecology

## 2020-08-17 DIAGNOSIS — Z01419 Encounter for gynecological examination (general) (routine) without abnormal findings: Secondary | ICD-10-CM | POA: Diagnosis not present

## 2020-08-17 DIAGNOSIS — Z23 Encounter for immunization: Secondary | ICD-10-CM

## 2020-08-17 DIAGNOSIS — Z13820 Encounter for screening for osteoporosis: Secondary | ICD-10-CM | POA: Diagnosis not present

## 2020-08-17 DIAGNOSIS — Z6822 Body mass index (BMI) 22.0-22.9, adult: Secondary | ICD-10-CM | POA: Diagnosis not present

## 2020-08-17 DIAGNOSIS — M810 Age-related osteoporosis without current pathological fracture: Secondary | ICD-10-CM

## 2020-08-17 DIAGNOSIS — Z1231 Encounter for screening mammogram for malignant neoplasm of breast: Secondary | ICD-10-CM | POA: Diagnosis not present

## 2020-08-17 NOTE — Progress Notes (Signed)
   Covid-19 Vaccination Clinic  Name:  Natalie Black    MRN: [id number] DOB: 09/27/1946  08/17/2020  Natalie Black was observed post Covid-19 immunization for 15 minutes without incident. She was provided with Vaccine Information Sheet and instruction to access the V-Safe system.   Natalie Black was instructed to call 911 with any severe reactions post vaccine: Marland Kitchen Difficulty breathing  . Swelling of face and throat  . A fast heartbeat  . A bad rash all over body  . Dizziness and weakness

## 2020-08-21 DIAGNOSIS — H35031 Hypertensive retinopathy, right eye: Secondary | ICD-10-CM | POA: Diagnosis not present

## 2020-08-21 DIAGNOSIS — H25813 Combined forms of age-related cataract, bilateral: Secondary | ICD-10-CM | POA: Diagnosis not present

## 2020-08-21 DIAGNOSIS — H40013 Open angle with borderline findings, low risk, bilateral: Secondary | ICD-10-CM | POA: Diagnosis not present

## 2020-08-21 DIAGNOSIS — H43812 Vitreous degeneration, left eye: Secondary | ICD-10-CM | POA: Diagnosis not present

## 2020-08-24 ENCOUNTER — Other Ambulatory Visit: Payer: Self-pay | Admitting: Gynecology

## 2020-08-24 DIAGNOSIS — Z13820 Encounter for screening for osteoporosis: Secondary | ICD-10-CM

## 2020-09-11 DIAGNOSIS — H2513 Age-related nuclear cataract, bilateral: Secondary | ICD-10-CM | POA: Diagnosis not present

## 2020-09-19 DIAGNOSIS — I10 Essential (primary) hypertension: Secondary | ICD-10-CM | POA: Diagnosis not present

## 2020-09-19 DIAGNOSIS — F40240 Claustrophobia: Secondary | ICD-10-CM | POA: Diagnosis not present

## 2020-09-19 DIAGNOSIS — Z0001 Encounter for general adult medical examination with abnormal findings: Secondary | ICD-10-CM | POA: Diagnosis not present

## 2020-09-19 DIAGNOSIS — Z23 Encounter for immunization: Secondary | ICD-10-CM | POA: Diagnosis not present

## 2020-09-19 DIAGNOSIS — Z1389 Encounter for screening for other disorder: Secondary | ICD-10-CM | POA: Diagnosis not present

## 2020-09-19 DIAGNOSIS — E782 Mixed hyperlipidemia: Secondary | ICD-10-CM | POA: Diagnosis not present

## 2020-09-19 DIAGNOSIS — Z6823 Body mass index (BMI) 23.0-23.9, adult: Secondary | ICD-10-CM | POA: Diagnosis not present

## 2020-09-19 DIAGNOSIS — Z1331 Encounter for screening for depression: Secondary | ICD-10-CM | POA: Diagnosis not present

## 2020-09-26 DIAGNOSIS — H2512 Age-related nuclear cataract, left eye: Secondary | ICD-10-CM | POA: Diagnosis not present

## 2020-09-26 DIAGNOSIS — H268 Other specified cataract: Secondary | ICD-10-CM | POA: Diagnosis not present

## 2020-09-26 DIAGNOSIS — H25812 Combined forms of age-related cataract, left eye: Secondary | ICD-10-CM | POA: Diagnosis not present

## 2020-10-02 DIAGNOSIS — H2511 Age-related nuclear cataract, right eye: Secondary | ICD-10-CM | POA: Diagnosis not present

## 2020-10-10 DIAGNOSIS — H2511 Age-related nuclear cataract, right eye: Secondary | ICD-10-CM | POA: Diagnosis not present

## 2020-10-10 DIAGNOSIS — H268 Other specified cataract: Secondary | ICD-10-CM | POA: Diagnosis not present

## 2020-10-10 DIAGNOSIS — H25811 Combined forms of age-related cataract, right eye: Secondary | ICD-10-CM | POA: Diagnosis not present

## 2020-11-28 ENCOUNTER — Other Ambulatory Visit: Payer: Medicare PPO

## 2020-12-07 ENCOUNTER — Other Ambulatory Visit: Payer: Medicare PPO

## 2021-02-28 ENCOUNTER — Other Ambulatory Visit: Payer: Medicare PPO

## 2021-03-15 DIAGNOSIS — E7849 Other hyperlipidemia: Secondary | ICD-10-CM | POA: Diagnosis not present

## 2021-03-15 DIAGNOSIS — I10 Essential (primary) hypertension: Secondary | ICD-10-CM | POA: Diagnosis not present

## 2021-03-15 DIAGNOSIS — E559 Vitamin D deficiency, unspecified: Secondary | ICD-10-CM | POA: Diagnosis not present

## 2021-03-15 DIAGNOSIS — R7301 Impaired fasting glucose: Secondary | ICD-10-CM | POA: Diagnosis not present

## 2021-03-15 DIAGNOSIS — E782 Mixed hyperlipidemia: Secondary | ICD-10-CM | POA: Diagnosis not present

## 2021-03-15 DIAGNOSIS — Z1329 Encounter for screening for other suspected endocrine disorder: Secondary | ICD-10-CM | POA: Diagnosis not present

## 2021-03-19 DIAGNOSIS — F17210 Nicotine dependence, cigarettes, uncomplicated: Secondary | ICD-10-CM | POA: Diagnosis not present

## 2021-03-19 DIAGNOSIS — E7849 Other hyperlipidemia: Secondary | ICD-10-CM | POA: Diagnosis not present

## 2021-03-19 DIAGNOSIS — I10 Essential (primary) hypertension: Secondary | ICD-10-CM | POA: Diagnosis not present

## 2021-03-19 DIAGNOSIS — Z0001 Encounter for general adult medical examination with abnormal findings: Secondary | ICD-10-CM | POA: Diagnosis not present

## 2021-03-22 ENCOUNTER — Other Ambulatory Visit: Payer: Self-pay

## 2021-03-22 ENCOUNTER — Ambulatory Visit
Admission: RE | Admit: 2021-03-22 | Discharge: 2021-03-22 | Disposition: A | Discharge status: Home / Self Care | Payer: Medicare PPO | Source: Ambulatory Visit | Attending: Gynecology | Admitting: Gynecology

## 2021-03-22 DIAGNOSIS — Z13820 Encounter for screening for osteoporosis: Secondary | ICD-10-CM

## 2021-03-22 DIAGNOSIS — M8589 Other specified disorders of bone density and structure, multiple sites: Secondary | ICD-10-CM | POA: Diagnosis not present

## 2021-03-22 DIAGNOSIS — Z780 Asymptomatic menopausal state: Secondary | ICD-10-CM | POA: Diagnosis not present

## 2021-03-29 DIAGNOSIS — L669 Cicatricial alopecia, unspecified: Secondary | ICD-10-CM | POA: Diagnosis not present

## 2021-04-12 DIAGNOSIS — H269 Unspecified cataract: Secondary | ICD-10-CM | POA: Diagnosis not present

## 2021-04-12 DIAGNOSIS — E7849 Other hyperlipidemia: Secondary | ICD-10-CM | POA: Diagnosis not present

## 2021-04-12 DIAGNOSIS — R7301 Impaired fasting glucose: Secondary | ICD-10-CM | POA: Diagnosis not present

## 2021-04-12 DIAGNOSIS — Z6823 Body mass index (BMI) 23.0-23.9, adult: Secondary | ICD-10-CM | POA: Diagnosis not present

## 2021-04-12 DIAGNOSIS — Z8249 Family history of ischemic heart disease and other diseases of the circulatory system: Secondary | ICD-10-CM | POA: Diagnosis not present

## 2021-04-12 DIAGNOSIS — F40240 Claustrophobia: Secondary | ICD-10-CM | POA: Diagnosis not present

## 2021-04-12 DIAGNOSIS — I10 Essential (primary) hypertension: Secondary | ICD-10-CM | POA: Diagnosis not present

## 2021-04-12 DIAGNOSIS — L669 Cicatricial alopecia, unspecified: Secondary | ICD-10-CM | POA: Diagnosis not present

## 2021-04-24 DIAGNOSIS — R109 Unspecified abdominal pain: Secondary | ICD-10-CM | POA: Diagnosis not present

## 2021-05-21 DIAGNOSIS — H40003 Preglaucoma, unspecified, bilateral: Secondary | ICD-10-CM | POA: Diagnosis not present

## 2021-05-21 DIAGNOSIS — Z961 Presence of intraocular lens: Secondary | ICD-10-CM | POA: Diagnosis not present

## 2021-07-18 DIAGNOSIS — Z23 Encounter for immunization: Secondary | ICD-10-CM | POA: Diagnosis not present

## 2021-10-17 DIAGNOSIS — R7301 Impaired fasting glucose: Secondary | ICD-10-CM | POA: Diagnosis not present

## 2021-10-17 DIAGNOSIS — Z0001 Encounter for general adult medical examination with abnormal findings: Secondary | ICD-10-CM | POA: Diagnosis not present

## 2021-10-17 DIAGNOSIS — I10 Essential (primary) hypertension: Secondary | ICD-10-CM | POA: Diagnosis not present

## 2021-10-17 DIAGNOSIS — L669 Cicatricial alopecia, unspecified: Secondary | ICD-10-CM | POA: Diagnosis not present

## 2021-10-17 DIAGNOSIS — R630 Anorexia: Secondary | ICD-10-CM | POA: Diagnosis not present

## 2021-10-17 DIAGNOSIS — H269 Unspecified cataract: Secondary | ICD-10-CM | POA: Diagnosis not present

## 2021-10-17 DIAGNOSIS — Z8249 Family history of ischemic heart disease and other diseases of the circulatory system: Secondary | ICD-10-CM | POA: Diagnosis not present

## 2021-10-17 DIAGNOSIS — R6881 Early satiety: Secondary | ICD-10-CM | POA: Diagnosis not present

## 2021-10-17 DIAGNOSIS — E782 Mixed hyperlipidemia: Secondary | ICD-10-CM | POA: Diagnosis not present

## 2021-10-17 DIAGNOSIS — E7849 Other hyperlipidemia: Secondary | ICD-10-CM | POA: Diagnosis not present

## 2021-10-17 DIAGNOSIS — F40240 Claustrophobia: Secondary | ICD-10-CM | POA: Diagnosis not present

## 2021-10-17 DIAGNOSIS — Z6823 Body mass index (BMI) 23.0-23.9, adult: Secondary | ICD-10-CM | POA: Diagnosis not present

## 2021-10-17 DIAGNOSIS — D519 Vitamin B12 deficiency anemia, unspecified: Secondary | ICD-10-CM | POA: Diagnosis not present

## 2021-11-07 DIAGNOSIS — Z23 Encounter for immunization: Secondary | ICD-10-CM | POA: Diagnosis not present

## 2021-11-21 DIAGNOSIS — H35033 Hypertensive retinopathy, bilateral: Secondary | ICD-10-CM | POA: Diagnosis not present

## 2021-11-21 DIAGNOSIS — H43812 Vitreous degeneration, left eye: Secondary | ICD-10-CM | POA: Diagnosis not present

## 2021-11-21 DIAGNOSIS — H40003 Preglaucoma, unspecified, bilateral: Secondary | ICD-10-CM | POA: Diagnosis not present

## 2021-11-21 DIAGNOSIS — H35372 Puckering of macula, left eye: Secondary | ICD-10-CM | POA: Diagnosis not present

## 2022-01-07 DIAGNOSIS — E559 Vitamin D deficiency, unspecified: Secondary | ICD-10-CM | POA: Diagnosis not present

## 2022-01-07 DIAGNOSIS — M7918 Myalgia, other site: Secondary | ICD-10-CM | POA: Diagnosis not present

## 2022-01-07 DIAGNOSIS — Z6823 Body mass index (BMI) 23.0-23.9, adult: Secondary | ICD-10-CM | POA: Diagnosis not present

## 2022-04-23 DIAGNOSIS — Z8249 Family history of ischemic heart disease and other diseases of the circulatory system: Secondary | ICD-10-CM | POA: Diagnosis not present

## 2022-04-23 DIAGNOSIS — R0789 Other chest pain: Secondary | ICD-10-CM | POA: Diagnosis not present

## 2022-04-23 DIAGNOSIS — E7849 Other hyperlipidemia: Secondary | ICD-10-CM | POA: Diagnosis not present

## 2022-04-23 DIAGNOSIS — R7301 Impaired fasting glucose: Secondary | ICD-10-CM | POA: Diagnosis not present

## 2022-04-23 DIAGNOSIS — F40240 Claustrophobia: Secondary | ICD-10-CM | POA: Diagnosis not present

## 2022-04-23 DIAGNOSIS — Z1389 Encounter for screening for other disorder: Secondary | ICD-10-CM | POA: Diagnosis not present

## 2022-04-23 DIAGNOSIS — Z1331 Encounter for screening for depression: Secondary | ICD-10-CM | POA: Diagnosis not present

## 2022-04-23 DIAGNOSIS — Z6823 Body mass index (BMI) 23.0-23.9, adult: Secondary | ICD-10-CM | POA: Diagnosis not present

## 2022-04-23 DIAGNOSIS — I10 Essential (primary) hypertension: Secondary | ICD-10-CM | POA: Diagnosis not present

## 2022-04-26 DIAGNOSIS — Z8249 Family history of ischemic heart disease and other diseases of the circulatory system: Secondary | ICD-10-CM | POA: Diagnosis not present

## 2022-04-26 DIAGNOSIS — R0789 Other chest pain: Secondary | ICD-10-CM | POA: Diagnosis not present

## 2022-07-10 DIAGNOSIS — Z23 Encounter for immunization: Secondary | ICD-10-CM | POA: Diagnosis not present

## 2022-07-10 DIAGNOSIS — Z6832 Body mass index (BMI) 32.0-32.9, adult: Secondary | ICD-10-CM | POA: Diagnosis not present

## 2022-10-29 DIAGNOSIS — Z1329 Encounter for screening for other suspected endocrine disorder: Secondary | ICD-10-CM | POA: Diagnosis not present

## 2022-10-29 DIAGNOSIS — E559 Vitamin D deficiency, unspecified: Secondary | ICD-10-CM | POA: Diagnosis not present

## 2022-10-29 DIAGNOSIS — E7849 Other hyperlipidemia: Secondary | ICD-10-CM | POA: Diagnosis not present

## 2022-10-29 DIAGNOSIS — Z0001 Encounter for general adult medical examination with abnormal findings: Secondary | ICD-10-CM | POA: Diagnosis not present

## 2022-10-29 DIAGNOSIS — I10 Essential (primary) hypertension: Secondary | ICD-10-CM | POA: Diagnosis not present

## 2022-11-05 DIAGNOSIS — Z8249 Family history of ischemic heart disease and other diseases of the circulatory system: Secondary | ICD-10-CM | POA: Diagnosis not present

## 2022-11-05 DIAGNOSIS — Z0001 Encounter for general adult medical examination with abnormal findings: Secondary | ICD-10-CM | POA: Diagnosis not present

## 2022-11-05 DIAGNOSIS — E7849 Other hyperlipidemia: Secondary | ICD-10-CM | POA: Diagnosis not present

## 2022-11-05 DIAGNOSIS — F40240 Claustrophobia: Secondary | ICD-10-CM | POA: Diagnosis not present

## 2022-11-05 DIAGNOSIS — R7301 Impaired fasting glucose: Secondary | ICD-10-CM | POA: Diagnosis not present

## 2022-11-05 DIAGNOSIS — Z6823 Body mass index (BMI) 23.0-23.9, adult: Secondary | ICD-10-CM | POA: Diagnosis not present

## 2022-11-05 DIAGNOSIS — I10 Essential (primary) hypertension: Secondary | ICD-10-CM | POA: Diagnosis not present

## 2022-11-05 DIAGNOSIS — Z23 Encounter for immunization: Secondary | ICD-10-CM | POA: Diagnosis not present

## 2022-11-25 DIAGNOSIS — H35372 Puckering of macula, left eye: Secondary | ICD-10-CM | POA: Diagnosis not present

## 2022-11-25 DIAGNOSIS — H40013 Open angle with borderline findings, low risk, bilateral: Secondary | ICD-10-CM | POA: Diagnosis not present

## 2022-11-25 DIAGNOSIS — H35033 Hypertensive retinopathy, bilateral: Secondary | ICD-10-CM | POA: Diagnosis not present

## 2022-11-25 DIAGNOSIS — H43812 Vitreous degeneration, left eye: Secondary | ICD-10-CM | POA: Diagnosis not present

## 2023-07-01 DIAGNOSIS — Z23 Encounter for immunization: Secondary | ICD-10-CM | POA: Diagnosis not present

## 2023-08-07 DIAGNOSIS — Z23 Encounter for immunization: Secondary | ICD-10-CM | POA: Diagnosis not present

## 2023-11-11 DIAGNOSIS — I10 Essential (primary) hypertension: Secondary | ICD-10-CM | POA: Diagnosis not present

## 2023-11-11 DIAGNOSIS — R7301 Impaired fasting glucose: Secondary | ICD-10-CM | POA: Diagnosis not present

## 2023-11-11 DIAGNOSIS — E782 Mixed hyperlipidemia: Secondary | ICD-10-CM | POA: Diagnosis not present

## 2023-11-11 DIAGNOSIS — Z1321 Encounter for screening for nutritional disorder: Secondary | ICD-10-CM | POA: Diagnosis not present

## 2023-11-11 DIAGNOSIS — E7849 Other hyperlipidemia: Secondary | ICD-10-CM | POA: Diagnosis not present

## 2023-11-11 DIAGNOSIS — Z1329 Encounter for screening for other suspected endocrine disorder: Secondary | ICD-10-CM | POA: Diagnosis not present

## 2023-11-26 DIAGNOSIS — Z961 Presence of intraocular lens: Secondary | ICD-10-CM | POA: Diagnosis not present

## 2023-11-26 DIAGNOSIS — H40013 Open angle with borderline findings, low risk, bilateral: Secondary | ICD-10-CM | POA: Diagnosis not present

## 2023-11-26 DIAGNOSIS — H35372 Puckering of macula, left eye: Secondary | ICD-10-CM | POA: Diagnosis not present

## 2023-11-26 DIAGNOSIS — H35033 Hypertensive retinopathy, bilateral: Secondary | ICD-10-CM | POA: Diagnosis not present

## 2023-11-26 DIAGNOSIS — H26493 Other secondary cataract, bilateral: Secondary | ICD-10-CM | POA: Diagnosis not present

## 2023-11-28 DIAGNOSIS — I10 Essential (primary) hypertension: Secondary | ICD-10-CM | POA: Diagnosis not present

## 2023-11-28 DIAGNOSIS — G4700 Insomnia, unspecified: Secondary | ICD-10-CM | POA: Diagnosis not present

## 2023-11-28 DIAGNOSIS — Z0001 Encounter for general adult medical examination with abnormal findings: Secondary | ICD-10-CM | POA: Diagnosis not present

## 2023-11-28 DIAGNOSIS — E782 Mixed hyperlipidemia: Secondary | ICD-10-CM | POA: Diagnosis not present

## 2023-11-28 DIAGNOSIS — Z6823 Body mass index (BMI) 23.0-23.9, adult: Secondary | ICD-10-CM | POA: Diagnosis not present

## 2023-11-28 DIAGNOSIS — R7301 Impaired fasting glucose: Secondary | ICD-10-CM | POA: Diagnosis not present

## 2024-01-22 DIAGNOSIS — Z6823 Body mass index (BMI) 23.0-23.9, adult: Secondary | ICD-10-CM | POA: Diagnosis not present

## 2024-01-22 DIAGNOSIS — Z8249 Family history of ischemic heart disease and other diseases of the circulatory system: Secondary | ICD-10-CM | POA: Diagnosis not present

## 2024-01-22 DIAGNOSIS — I10 Essential (primary) hypertension: Secondary | ICD-10-CM | POA: Diagnosis not present

## 2024-01-22 DIAGNOSIS — R7301 Impaired fasting glucose: Secondary | ICD-10-CM | POA: Diagnosis not present

## 2024-01-22 DIAGNOSIS — R0789 Other chest pain: Secondary | ICD-10-CM | POA: Diagnosis not present

## 2024-02-02 DIAGNOSIS — R9389 Abnormal findings on diagnostic imaging of other specified body structures: Secondary | ICD-10-CM | POA: Diagnosis not present

## 2024-02-02 DIAGNOSIS — R0789 Other chest pain: Secondary | ICD-10-CM | POA: Diagnosis not present

## 2024-02-02 DIAGNOSIS — R0609 Other forms of dyspnea: Secondary | ICD-10-CM | POA: Diagnosis not present

## 2024-02-03 DIAGNOSIS — R918 Other nonspecific abnormal finding of lung field: Secondary | ICD-10-CM | POA: Diagnosis not present

## 2024-02-03 DIAGNOSIS — J984 Other disorders of lung: Secondary | ICD-10-CM | POA: Diagnosis not present

## 2024-02-03 DIAGNOSIS — R0609 Other forms of dyspnea: Secondary | ICD-10-CM | POA: Diagnosis not present

## 2024-02-03 DIAGNOSIS — E279 Disorder of adrenal gland, unspecified: Secondary | ICD-10-CM | POA: Diagnosis not present

## 2024-02-03 DIAGNOSIS — R0789 Other chest pain: Secondary | ICD-10-CM | POA: Diagnosis not present

## 2024-02-03 DIAGNOSIS — J8410 Pulmonary fibrosis, unspecified: Secondary | ICD-10-CM | POA: Diagnosis not present

## 2024-02-11 DIAGNOSIS — R0789 Other chest pain: Secondary | ICD-10-CM | POA: Diagnosis not present

## 2024-06-15 DIAGNOSIS — Z8249 Family history of ischemic heart disease and other diseases of the circulatory system: Secondary | ICD-10-CM | POA: Diagnosis not present

## 2024-06-15 DIAGNOSIS — I10 Essential (primary) hypertension: Secondary | ICD-10-CM | POA: Diagnosis not present

## 2024-06-15 DIAGNOSIS — Z6823 Body mass index (BMI) 23.0-23.9, adult: Secondary | ICD-10-CM | POA: Diagnosis not present

## 2024-06-15 DIAGNOSIS — G4700 Insomnia, unspecified: Secondary | ICD-10-CM | POA: Diagnosis not present

## 2024-06-15 DIAGNOSIS — E782 Mixed hyperlipidemia: Secondary | ICD-10-CM | POA: Diagnosis not present

## 2024-06-22 ENCOUNTER — Other Ambulatory Visit (HOSPITAL_BASED_OUTPATIENT_CLINIC_OR_DEPARTMENT_OTHER): Payer: Self-pay

## 2024-06-22 DIAGNOSIS — R918 Other nonspecific abnormal finding of lung field: Secondary | ICD-10-CM | POA: Diagnosis not present

## 2024-06-22 DIAGNOSIS — J984 Other disorders of lung: Secondary | ICD-10-CM | POA: Diagnosis not present

## 2024-08-16 DIAGNOSIS — N644 Mastodynia: Secondary | ICD-10-CM | POA: Diagnosis not present

## 2024-08-16 DIAGNOSIS — Z23 Encounter for immunization: Secondary | ICD-10-CM | POA: Diagnosis not present

## 2024-08-16 DIAGNOSIS — Z6823 Body mass index (BMI) 23.0-23.9, adult: Secondary | ICD-10-CM | POA: Diagnosis not present

## 2024-09-02 DIAGNOSIS — Z1231 Encounter for screening mammogram for malignant neoplasm of breast: Secondary | ICD-10-CM | POA: Diagnosis not present
